# Patient Record
Sex: Male | Born: 1985 | Race: White | Hispanic: No | Marital: Single | Smoking: Current every day smoker
Health system: Southern US, Community
[De-identification: ages and names within clinical notes are randomized; demographics above are authoritative.]

---

## 2018-04-15 ENCOUNTER — Emergency Department (HOSPITAL_COMMUNITY)
Admission: EM | Admit: 2018-04-15 | Discharge: 2018-04-15 | Disposition: A | Discharge status: Home / Self Care | Payer: Self-pay | Attending: Emergency Medicine | Admitting: Emergency Medicine

## 2018-04-15 ENCOUNTER — Other Ambulatory Visit: Payer: Self-pay

## 2018-04-15 ENCOUNTER — Emergency Department (HOSPITAL_COMMUNITY): Payer: Self-pay

## 2018-04-15 DIAGNOSIS — M542 Cervicalgia: Secondary | ICD-10-CM | POA: Insufficient documentation

## 2018-04-15 MED ORDER — NAPROXEN 500 MG PO TABS: 500.0000 mg | ORAL_TABLET | Freq: Two times a day (BID) | ORAL | 0 refills | Status: AC

## 2018-04-15 MED ORDER — OXYCODONE HCL 5 MG PO TABS
5.0000 mg | ORAL_TABLET | Freq: Once | ORAL | Status: AC
  Administered 2018-04-15: 5 mg via ORAL
  Filled 2018-04-15: qty 1

## 2018-04-15 MED ORDER — METHOCARBAMOL 500 MG PO TABS: 500.0000 mg | ORAL_TABLET | Freq: Two times a day (BID) | ORAL | 0 refills | Status: AC

## 2018-04-15 MED ORDER — METHOCARBAMOL 500 MG PO TABS
500.0000 mg | ORAL_TABLET | Freq: Once | ORAL | Status: AC
  Administered 2018-04-15: 500 mg via ORAL
  Filled 2018-04-15: qty 1

## 2018-04-15 NOTE — ED Provider Notes (Signed)
[facility] EMERGENCY DEPARTMENT Provider Note   CSN: [id number] Arrival date & time: 04/15/18  0025     History   Chief Complaint Chief Complaint  Patient presents with  . Headache  . Neck Pain    HPI Joshua Pitts is a 32 y.o. male.  The history is provided by the patient and medical records.  Neck Pain       32 year old male with no significant past medical history presenting to the ED for atraumatic, right-sided neck pain for the past 2 weeks.  States it is a constant, dull, aching sensation along the right side of his neck.  States it seems to run up to the back of his head as well.  He denies any associated numbness, weakness, blurred vision, dizziness, confusion, changes in speech.  States pain is worse when turning his head in certain directions.  He has tried using icy hot and massage therapy without much relief.  He has not tried any oral medications for his symptoms.  Denies any prior history of neck problems.  No past medical history on file.  There are no active problems to display for this patient.       Home Medications    Prior to Admission medications   Not on File    Family History No family history on file.  Social History Social History   Tobacco Use  . Smoking status: Not on file  Substance Use Topics  . Alcohol use: Not on file  . Drug use: Not on file     Allergies   Tylenol [acetaminophen]   Review of Systems Review of Systems  Musculoskeletal: Positive for neck pain.  All other systems reviewed and are negative.    Physical Exam Updated Vital Signs BP (!) 108/59 (BP Location: Right Arm)   Pulse 69   Temp (!) 97.5 F (36.4 C) (Oral)   Resp 16   Ht 5\' 7"  (1.702 m)   Wt 70.3 kg   SpO2 98%   BMI 24.28 kg/m   Physical Exam  Constitutional: He is oriented to person, place, and time. He appears well-developed and well-nourished. No distress.  HENT:  Head: Normocephalic and atraumatic.  Right Ear:  External ear normal.  Left Ear: External ear normal.  Mouth/Throat: Oropharynx is clear and moist.  Eyes: Pupils are equal, round, and reactive to light. Conjunctivae and EOM are normal.  Neck: Normal range of motion and full passive range of motion without pain. Neck supple. Muscular tenderness present. No neck rigidity.  Muscular tenderness along right side of neck, seems to follow along the SCM; there is no apparent mass or deformity; no midline tenderness or step-off; full ROM maintained, some pain when turning his head fully, mostly to the left  Cardiovascular: Normal rate, regular rhythm and normal heart sounds.  No murmur heard. Pulmonary/Chest: Effort normal and breath sounds normal. No respiratory distress. He has no wheezes. He has no rhonchi.  Abdominal: Soft. Bowel sounds are normal. There is no tenderness. There is no guarding.  Musculoskeletal: Normal range of motion. He exhibits no edema.  Lymphadenopathy:    He has no cervical adenopathy.  Neurological: He is alert and oriented to person, place, and time. He has normal strength. He displays no tremor. No cranial nerve deficit or sensory deficit. He displays no seizure activity.  AAOx3, answering questions and following commands appropriately; equal strength UE and LE bilaterally; CN grossly intact; moves all extremities appropriately without ataxia; no focal neuro deficits  or facial asymmetry appreciated  Skin: Skin is warm and dry. No rash noted. He is not diaphoretic.  Psychiatric: He has a normal mood and affect. His behavior is normal. Thought content normal.  Nursing note and vitals reviewed.    ED Treatments / Results  Labs (all labs ordered are listed, but only abnormal results are displayed) Labs Reviewed - No data to display  EKG None  Radiology Dg Cervical Spine Complete  Result Date: 04/15/2018 CLINICAL DATA:  Posterior neck pain for 2 weeks.  No known injury. EXAM: CERVICAL SPINE - COMPLETE 4+ VIEW  COMPARISON:  None. FINDINGS: There is no evidence of cervical spine fracture or prevertebral soft tissue swelling. Alignment is normal. No other significant bone abnormalities are identified. IMPRESSION: Negative cervical spine radiographs. Electronically Signed   By: Burman NievesWilliam  Stevens M.D.   On: 04/15/2018 01:21    Procedures Procedures (including critical care time)  Medications Ordered in ED Medications  oxyCODONE (Oxy IR/ROXICODONE) immediate release tablet 5 mg (5 mg Oral Given 04/15/18 0057)  methocarbamol (ROBAXIN) tablet 500 mg (500 mg Oral Given 04/15/18 0057)     Initial Impression / Assessment and Plan / ED Course  I have reviewed the triage vital signs and the nursing notes.  Pertinent labs & imaging results that were available during my care of the patient were reviewed by me and considered in my medical decision making (see chart for details).  32 year old male here with atraumatic, right-sided neck pain for the past 2 weeks.  He is awake, alert, appropriately oriented.  Vital signs are stable.  He is nontoxic in appearance.  Tenderness along the right side of the neck, seems to mostly track along the SCM.  There is no apparent mass, bulge, or deformity.  No noted lymphadenopathy.  He has full range of motion of the neck but some pain when turning his head fully to the left.  He has normal strength and sensation of all 4 extremities, normal gait.  No signs or symptoms suggestive of central cord syndrome.  Given his young age and very limited risk factors without any neurologic symptoms, do not suspect that this is a vascular issue such as vertebral artery dissection, etc.  This seems to be mostly muscular.  Screening x-ray was obtained which is negative.  Patient has had some relief with medications here.  Plan to d/c home with symptomatic care, given follow-up with patient care center as he does not have PCP currently.  He will return here for any new/acute changes.  Final Clinical  Impressions(s) / ED Diagnoses   Final diagnoses:  Neck pain    ED Discharge Orders         Ordered    naproxen (NAPROSYN) 500 MG tablet  2 times daily with meals     04/15/18 0201    methocarbamol (ROBAXIN) 500 MG tablet  2 times daily     04/15/18 0201           Garlon HatchetSanders, Len Azeez M, PA-C 04/15/18 0320    Gilda CreasePollina, Christopher J, MD 04/15/18 [phone]

## 2018-04-15 NOTE — ED Triage Notes (Signed)
Patient c/o right sided neck pain that radiates to head.This has been ongoing x2 weeks.

## 2018-04-15 NOTE — Discharge Instructions (Signed)
Take the prescribed medication as directed. °Follow-up with the patient care center-- call for appt. °Return to the ED for new or worsening symptoms. °

## 2020-02-27 ENCOUNTER — Other Ambulatory Visit: Payer: Self-pay

## 2020-02-27 ENCOUNTER — Emergency Department (HOSPITAL_COMMUNITY)
Admission: EM | Admit: 2020-02-27 | Discharge: 2020-02-28 | Disposition: A | Discharge status: Home / Self Care | Payer: Self-pay | Attending: Emergency Medicine | Admitting: Emergency Medicine

## 2020-02-27 ENCOUNTER — Encounter (HOSPITAL_COMMUNITY): Payer: Self-pay | Admitting: Emergency Medicine

## 2020-02-27 DIAGNOSIS — Z5321 Procedure and treatment not carried out due to patient leaving prior to being seen by health care provider: Secondary | ICD-10-CM | POA: Insufficient documentation

## 2020-02-27 DIAGNOSIS — R197 Diarrhea, unspecified: Secondary | ICD-10-CM | POA: Insufficient documentation

## 2020-02-27 DIAGNOSIS — R112 Nausea with vomiting, unspecified: Secondary | ICD-10-CM | POA: Insufficient documentation

## 2020-02-27 DIAGNOSIS — R1084 Generalized abdominal pain: Secondary | ICD-10-CM | POA: Insufficient documentation

## 2020-02-27 LAB — COMPREHENSIVE METABOLIC PANEL
ALT: 58 U/L — ABNORMAL HIGH (ref 0–44)
AST: 38 U/L (ref 15–41)
Albumin: 4 g/dL (ref 3.5–5.0)
Alkaline Phosphatase: 58 U/L (ref 38–126)
Anion gap: 9 (ref 5–15)
BUN: 8 mg/dL (ref 6–20)
CO2: 28 mmol/L (ref 22–32)
Calcium: 9.2 mg/dL (ref 8.9–10.3)
Chloride: 101 mmol/L (ref 98–111)
Creatinine, Ser: 1.13 mg/dL (ref 0.61–1.24)
GFR calc Af Amer: >60 mL/min (ref >60)
GFR calc non Af Amer: >60 mL/min (ref >60)
Glucose, Bld: 93 mg/dL (ref 70–99)
Potassium: 3.8 mmol/L (ref 3.5–5.1)
Sodium: 138 mmol/L (ref 135–145)
Total Bilirubin: 0.7 mg/dL (ref 0.3–1.2)
Total Protein: 6.8 g/dL (ref 6.5–8.1)

## 2020-02-27 LAB — CBC
HCT: 45.3 % (ref 39.0–52.0)
Hemoglobin: 15.1 g/dL (ref 13.0–17.0)
MCH: 28.8 pg (ref 26.0–34.0)
MCHC: 33.3 g/dL (ref 30.0–36.0)
MCV: 86.3 fL (ref 80.0–100.0)
Platelets: 216 10*3/uL (ref 150–400)
RBC: 5.25 MIL/uL (ref 4.22–5.81)
RDW: 11.6 % (ref 11.5–15.5)
WBC: 5.8 10*3/uL (ref 4.0–10.5)
nRBC: 0 % (ref 0.0–0.2)

## 2020-02-27 LAB — LIPASE, BLOOD: Lipase: 29 U/L (ref 11–51)

## 2020-02-27 NOTE — ED Triage Notes (Signed)
C/o generalized abd pain since waking up at 6am with nausea, vomiting, and diarrhea.  States pain is worse on right side.  Denies urinary complaints.

## 2020-02-27 NOTE — ED Notes (Signed)
Called  X2 no answer. 

## 2020-03-08 IMAGING — CR DG CERVICAL SPINE COMPLETE 4+V
6 series · 6 of 6 positions shown · non-contrast
Comparison: None.

CLINICAL DATA: Posterior neck pain for 2 weeks.  No known injury.

EXAM:
CERVICAL SPINE - COMPLETE 4+ VIEW

[c-spine lat (1 of 2)]
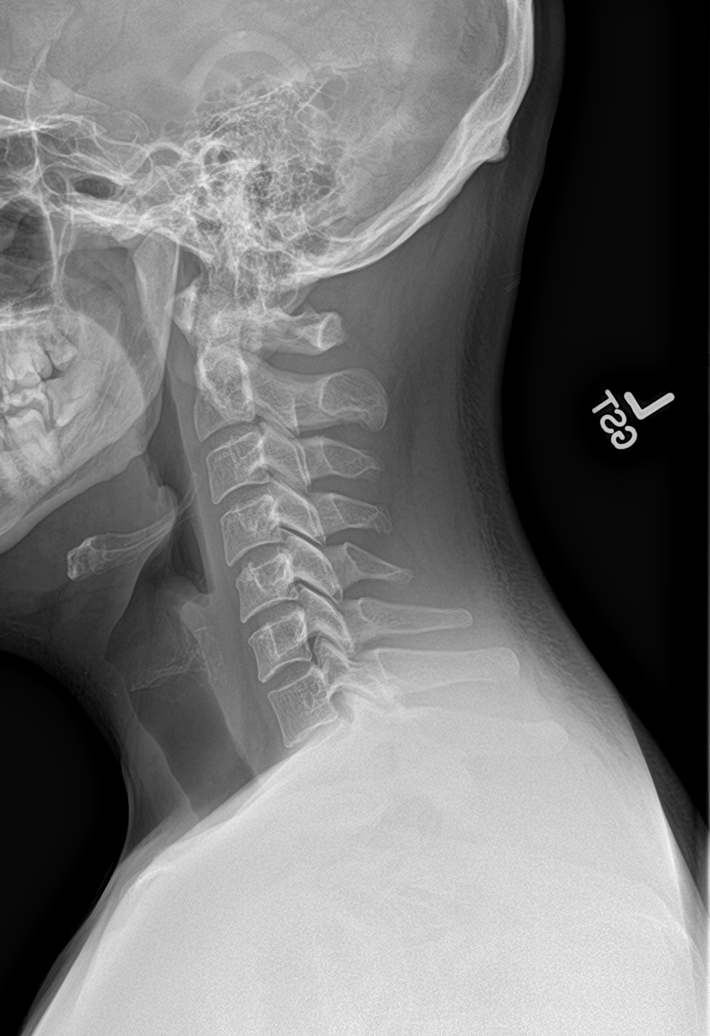

[c-spine obl (1 of 2)]
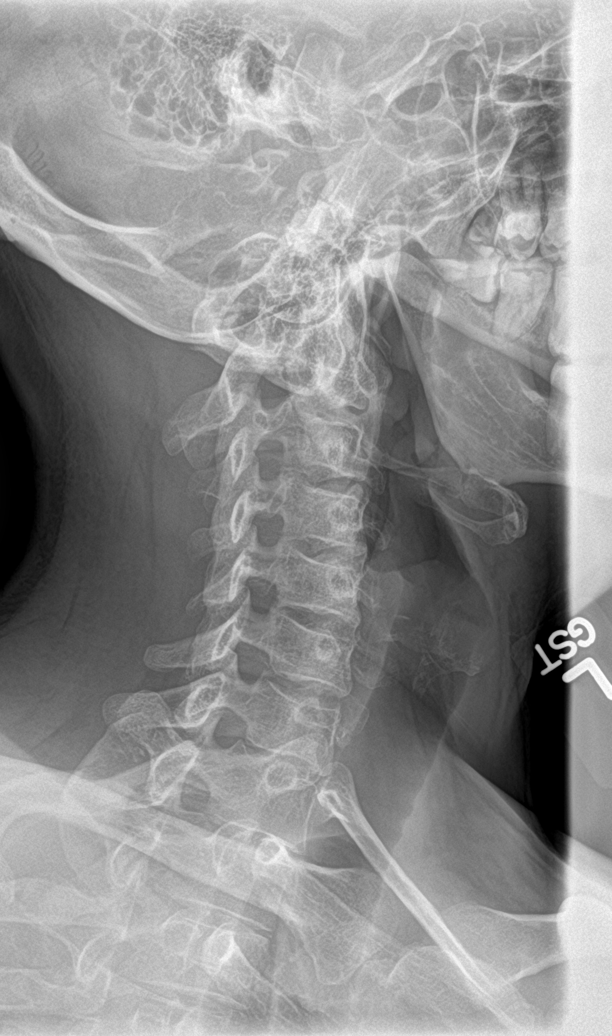

[c-spine obl (2 of 2)]
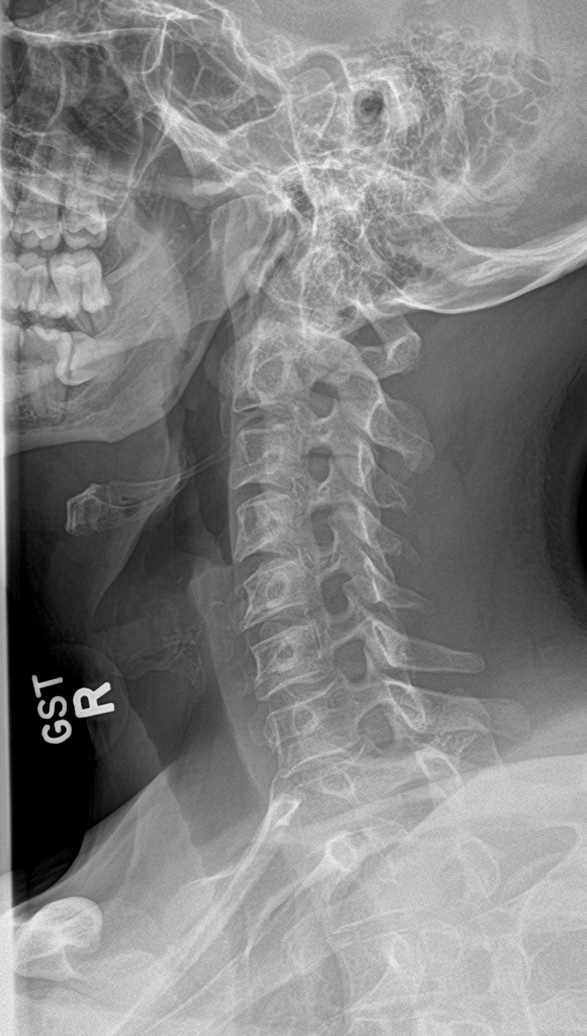

[c-spine ap]
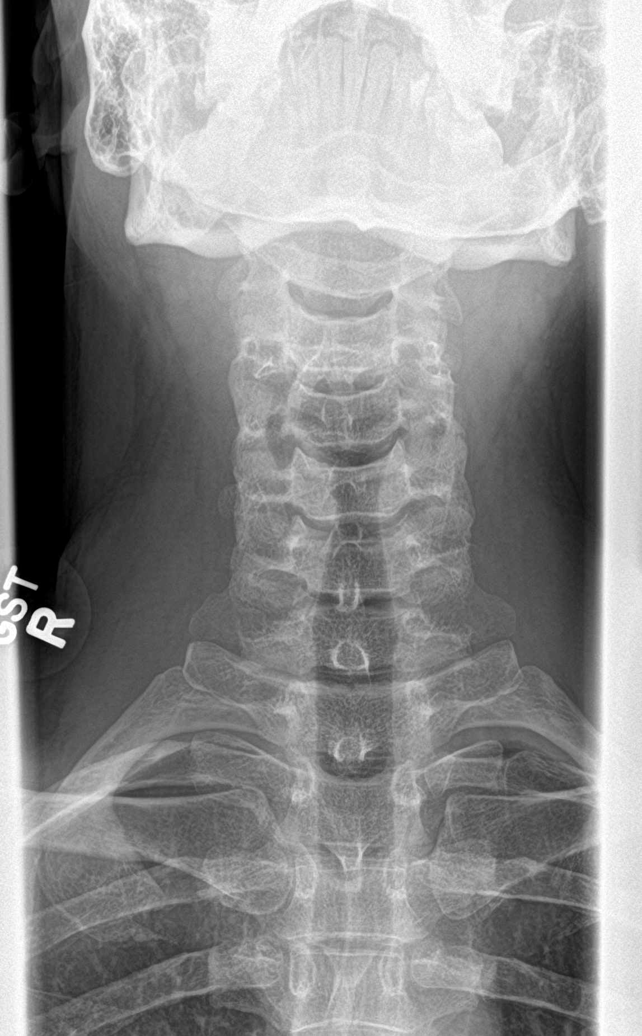

[c-spine open mouth]
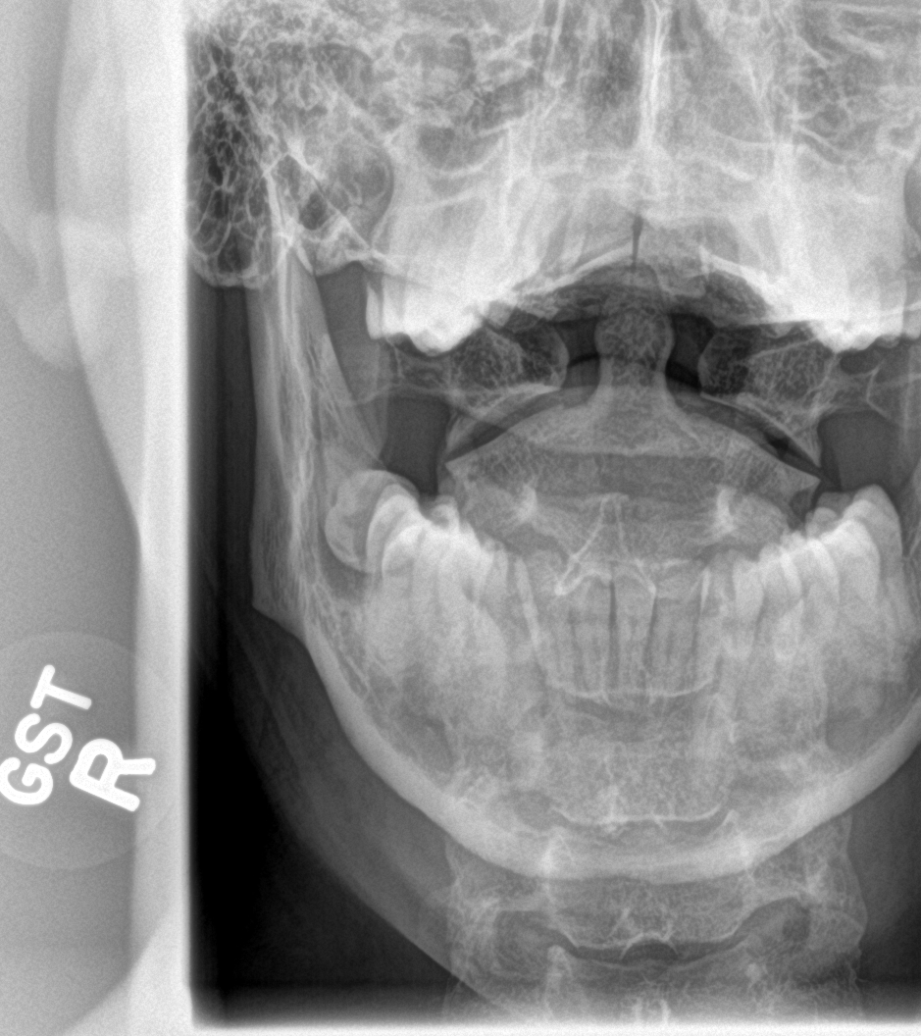

[c-spine lat (2 of 2)]
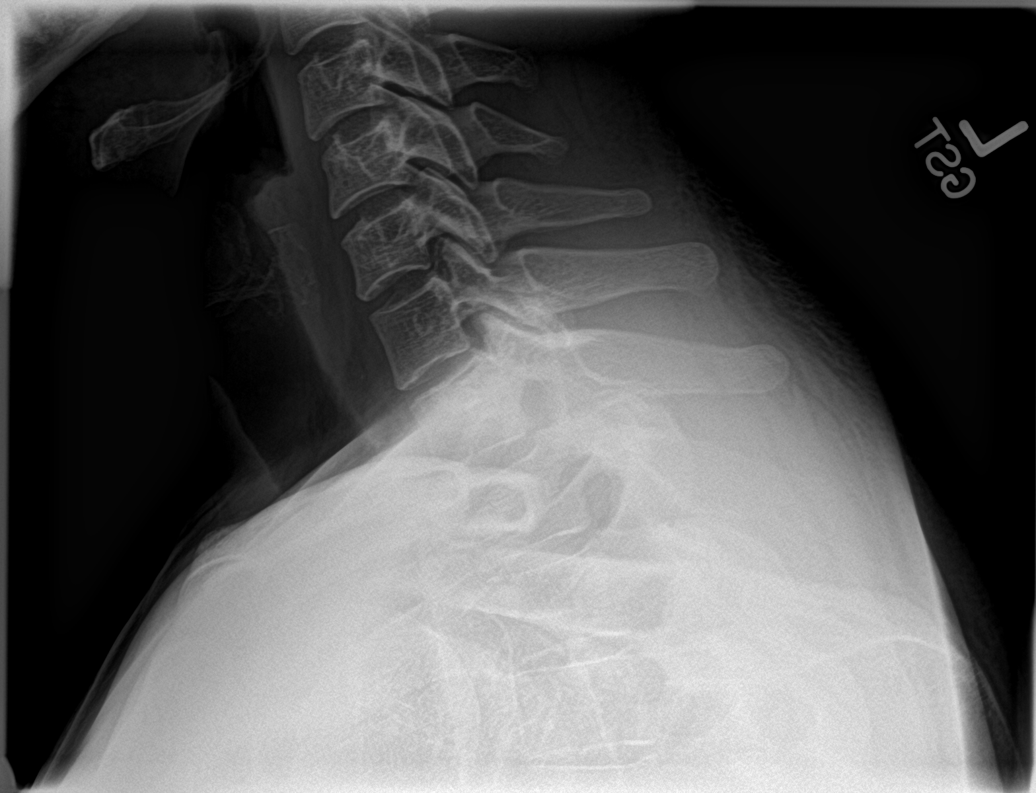

[6 of 6 positions shown; findings below may reference images not displayed]

FINDINGS: There is no evidence of cervical spine fracture or prevertebral soft
tissue swelling. Alignment is normal. No other significant bone
abnormalities are identified.
IMPRESSION: Negative cervical spine radiographs.
